# Patient Record
Sex: Female | Born: 1940 | Race: White | Hispanic: No | Marital: Married | Smoking: Never smoker
Health system: Southern US, Community
[De-identification: ages and names within clinical notes are randomized; demographics above are authoritative.]

## PROBLEM LIST (undated history)

## (undated) DIAGNOSIS — K219 Gastro-esophageal reflux disease without esophagitis: Secondary | ICD-10-CM

## (undated) DIAGNOSIS — IMO0001 Reserved for inherently not codable concepts without codable children: Secondary | ICD-10-CM

## (undated) HISTORY — DX: Gastro-esophageal reflux disease without esophagitis: K21.9

## (undated) HISTORY — PX: NOSE SURGERY: SHX723

## (undated) HISTORY — DX: Reserved for inherently not codable concepts without codable children: IMO0001

## (undated) HISTORY — PX: OTHER SURGICAL HISTORY: SHX169

---

## 1998-09-14 ENCOUNTER — Other Ambulatory Visit: Admission: RE | Admit: 1998-09-14 | Discharge: 1998-09-14 | Payer: Self-pay | Admitting: Oral Surgery

## 2000-09-04 ENCOUNTER — Other Ambulatory Visit: Admission: RE | Admit: 2000-09-04 | Discharge: 2000-09-04 | Payer: Self-pay | Admitting: Obstetrics and Gynecology

## 2001-12-23 ENCOUNTER — Other Ambulatory Visit: Admission: RE | Admit: 2001-12-23 | Discharge: 2001-12-23 | Payer: Self-pay | Admitting: Obstetrics and Gynecology

## 2002-02-27 ENCOUNTER — Encounter: Payer: Self-pay | Admitting: Emergency Medicine

## 2002-02-27 ENCOUNTER — Inpatient Hospital Stay (HOSPITAL_COMMUNITY): Admission: EM | Admit: 2002-02-27 | Discharge: 2002-02-28 | Payer: Self-pay | Admitting: Emergency Medicine

## 2002-12-25 ENCOUNTER — Other Ambulatory Visit: Admission: RE | Admit: 2002-12-25 | Discharge: 2002-12-25 | Payer: Self-pay | Admitting: Obstetrics and Gynecology

## 2005-01-06 ENCOUNTER — Ambulatory Visit (HOSPITAL_COMMUNITY): Admission: RE | Admit: 2005-01-06 | Discharge: 2005-01-06 | Payer: Self-pay | Admitting: Gastroenterology

## 2008-10-20 ENCOUNTER — Ambulatory Visit: Payer: Self-pay | Admitting: Obstetrics and Gynecology

## 2008-10-21 ENCOUNTER — Ambulatory Visit: Payer: Self-pay | Admitting: Obstetrics and Gynecology

## 2008-11-03 ENCOUNTER — Encounter: Admission: RE | Admit: 2008-11-03 | Discharge: 2008-11-03 | Payer: Self-pay | Admitting: Gastroenterology

## 2008-11-03 ENCOUNTER — Emergency Department (HOSPITAL_COMMUNITY): Admission: EM | Admit: 2008-11-03 | Discharge: 2008-11-03 | Payer: Self-pay | Admitting: Emergency Medicine

## 2009-07-12 ENCOUNTER — Encounter: Admission: RE | Admit: 2009-07-12 | Discharge: 2009-07-12 | Payer: Self-pay | Admitting: Gastroenterology

## 2010-06-16 ENCOUNTER — Encounter: Admission: RE | Admit: 2010-06-16 | Discharge: 2010-06-16 | Payer: Self-pay | Admitting: Gastroenterology

## 2010-07-26 ENCOUNTER — Ambulatory Visit: Payer: Self-pay | Admitting: Obstetrics and Gynecology

## 2010-08-02 ENCOUNTER — Ambulatory Visit: Payer: Self-pay | Admitting: Obstetrics and Gynecology

## 2010-11-13 ENCOUNTER — Encounter: Payer: Self-pay | Admitting: Gastroenterology

## 2010-11-28 IMAGING — CT CT CHEST W/O CM
2 of 3 series · 14 of 31 positions shown, 16 images · non-contrast
Comparison: Chest CT 07/12/2009.

CLINICAL DATA: Follow-up lung nodule.  No history of malignancy.

CT CHEST WITHOUT CONTRAST
TECHNIQUE: Multidetector CT imaging of the chest was performed
following the standard protocol without IV contrast.

[Series 3: routine chest · axial · 0.70mm/px · z∈[-177,+18]mm · 6 of 55 slices shown, 8 images]
[im 8/55  mediastinal]
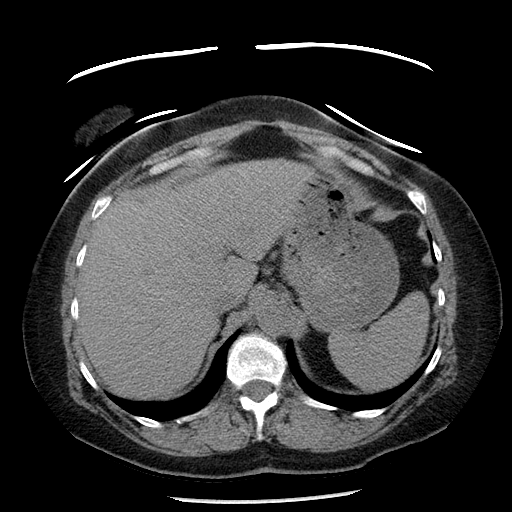
[im 8/55  lung]
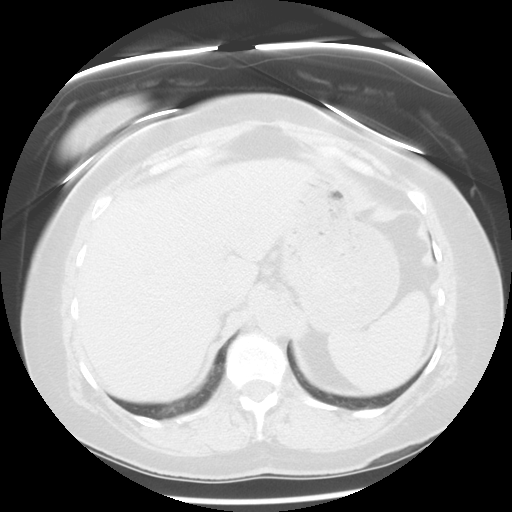
[im 16/55  lung]
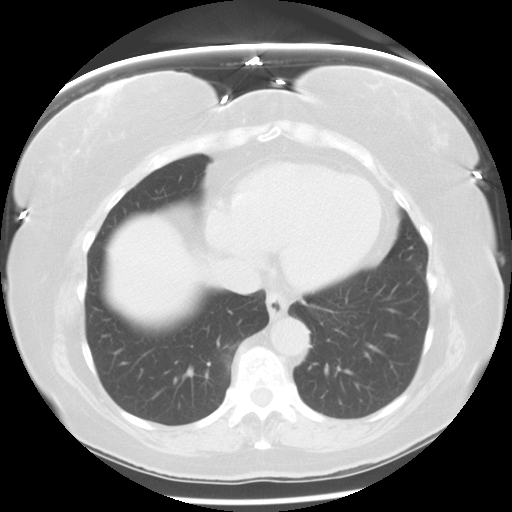
[im 24/55  lung]
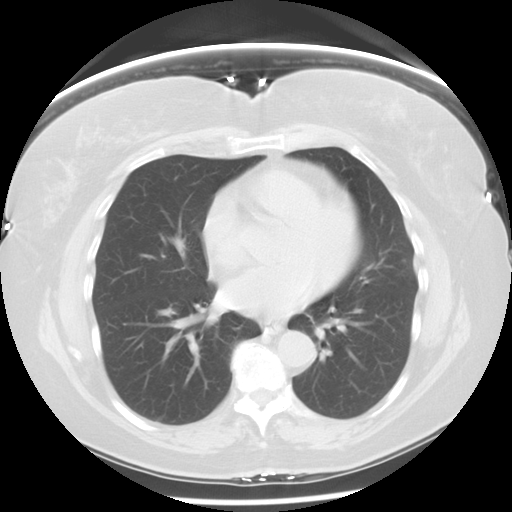
[im 31/55  lung]
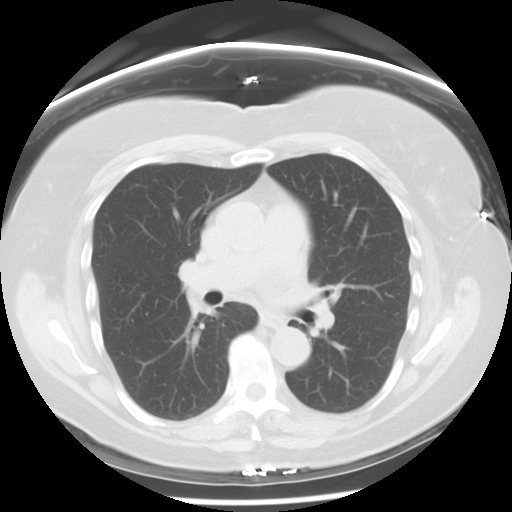
[im 39/55  mediastinal]
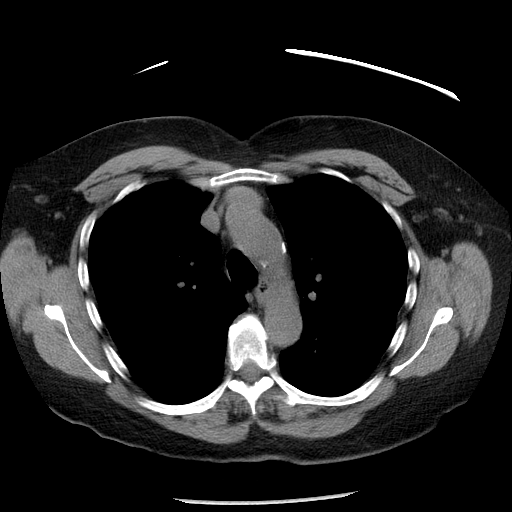
[im 39/55  lung]
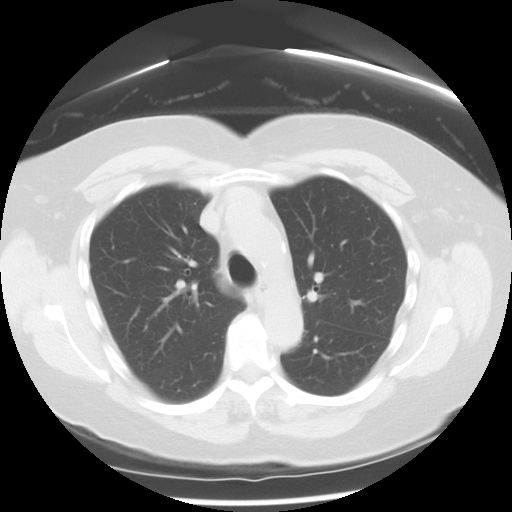
[im 47/55  lung]
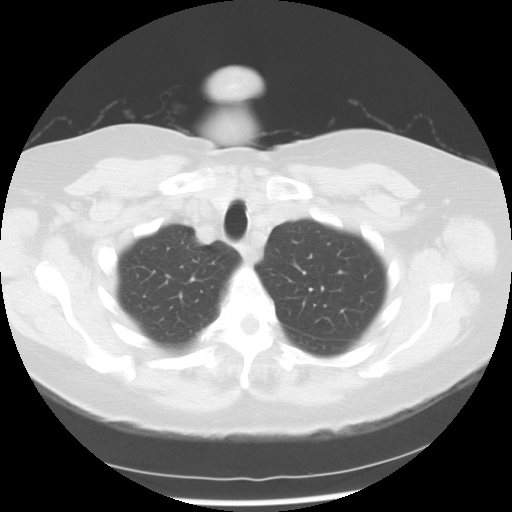

[Series 602: sagittal body · sagittal · 0.70mm/px · 8 of 145 slices shown]
[im 16/145  mediastinal]
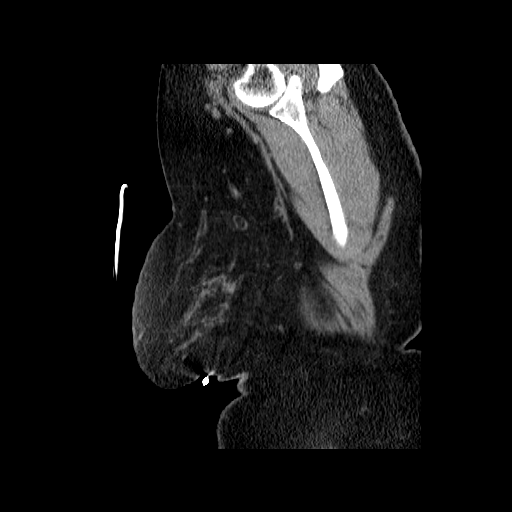
[im 31/145  mediastinal]
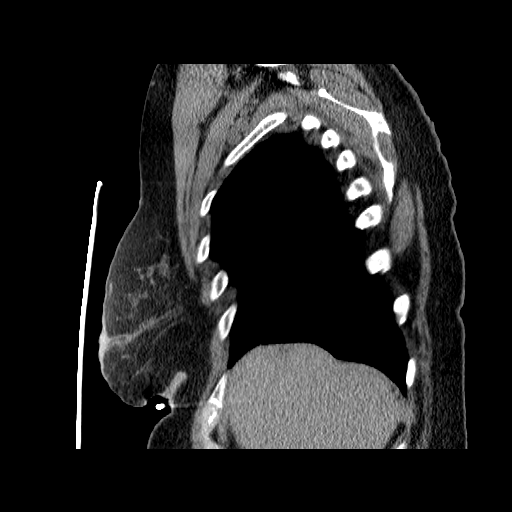
[im 46/145  mediastinal]
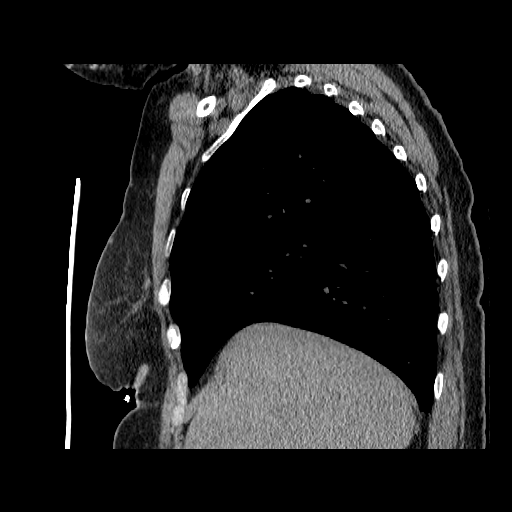
[im 69/145  mediastinal]
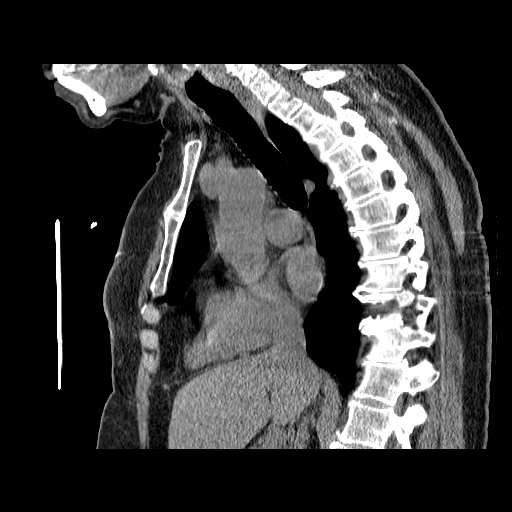
[im 76/145  mediastinal]
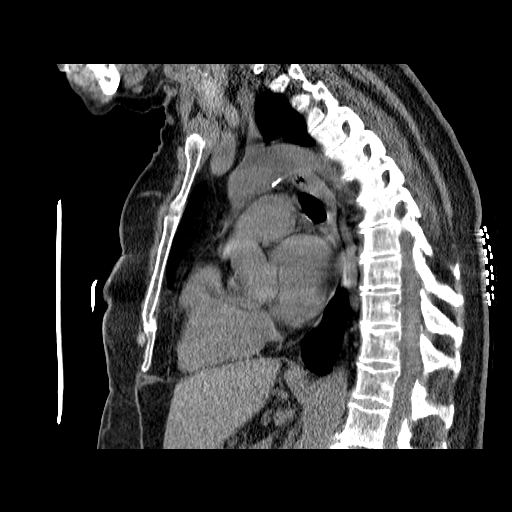
[im 99/145  mediastinal]
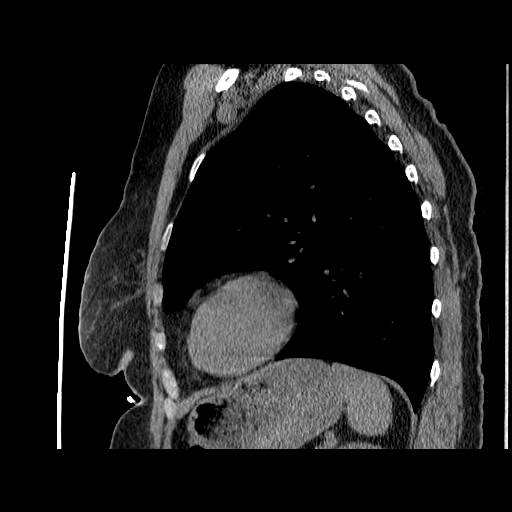
[im 114/145  mediastinal]
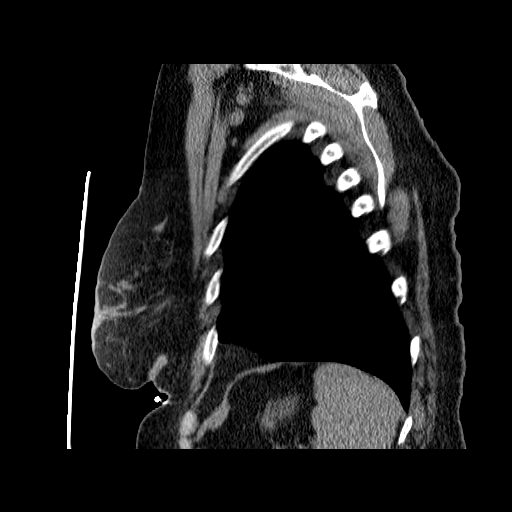
[im 129/145  mediastinal]
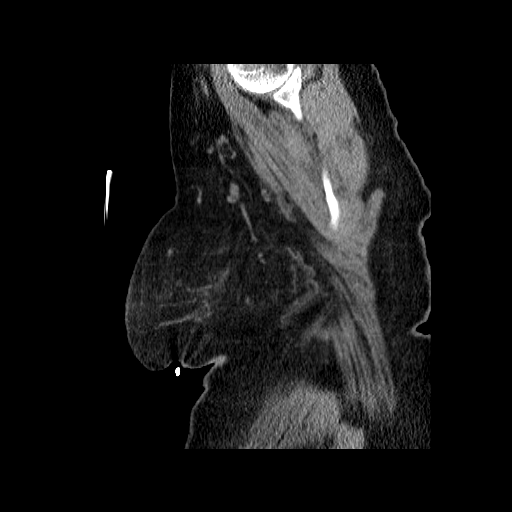

[14 of 31 positions shown; findings below may reference images not displayed]

FINDINGS: Mildly prominent axillary lymph nodes are stable with
fatty hila.  There are no enlarged mediastinal or hilar lymph
nodes.  There is no pleural or pericardial effusion.

Previously noted nodular density in the lingula is now barely
percent wall on image 32.  This measures 3 mm in diameter.  A tiny
subpleural nodular density in the right upper lobe on image 18 is
stable.  There are no suspicious pulmonary nodules.

The visualized upper abdomen appears unremarkable.  Mild thoracic
spondylosis and scoliosis are unchanged.
IMPRESSION: Decreased size of tiny lingular nodule consistent with a benign
etiology.  No acute or suspicious findings.

## 2011-03-10 NOTE — Op Note (Signed)
Anita Archer, Anita Archer             ACCOUNT NO.:  [id number]   MEDICAL RECORD NO.:  [id number]          PATIENT TYPE:  AMB   LOCATION:  ENDO                         FACILITY:  [facility]   PHYSICIAN:  John C. Madilyn Fireman, M.D.    DATE OF BIRTH:  12-28-1940   DATE OF PROCEDURE:  01/06/2005  DATE OF DISCHARGE:                                 OPERATIVE REPORT   INDICATIONS FOR PROCEDURE:  Chronic cough as well as epigastric pain in a  patient with history of peptic ulcer disease.   PROCEDURE:  The patient was placed in the left lateral decubitus position  and placed on the pulse monitor with continuous low-flow oxygen delivered by  nasal cannula. She was sedated with 50 mcg IV fentanyl, 5 mg IV Versed. The  Olympus video endoscope was advanced under direct vision into the oropharynx  and esophagus. The esophagus was straight and of normal caliber with  squamocolumnar line at 36 cm above a 2-cm hiatal hernia. There is no visible  ring, stricture or esophagitis and no reflux seen during the procedure. The  stomach was entered, and a small amount of liquid secretions were suctioned  from the fundus. Retroflexed view of the cardia confirmed a hiatal hernia  and was otherwise unremarkable. The fundus, body, antrum and pylorus all  appeared normal. The duodenum was entered, and both bulb and second portion  were well inspected and appeared to be within normal limits, with exception  of some mild inflammatory changes of the bulb consistent with duodenitis.  The scope was then withdrawn, and the patient returned to the recovery room  in stable condition. She tolerated the procedure well. There were no  immediate complications.   IMPRESSION:  1.  Duodenitis.  2.  Hiatal hernia.   PLAN:  Await CLO-test and will treat for eradication of helicobacter if  positive, otherwise will treat with proton pump inhibitor.      JCH/MEDQ  D:  01/06/2005  T:  01/06/2005  Job:  [id number]

## 2011-03-10 NOTE — Op Note (Signed)
NAMESHARMILA, WROBLESKI             ACCOUNT NO.:  [id number]   MEDICAL RECORD NO.:  [id number]          PATIENT TYPE:  AMB   LOCATION:  ENDO                         FACILITY:  [facility]   PHYSICIAN:  John C. Madilyn Fireman, M.D.    DATE OF BIRTH:  [birth year]   DATE OF PROCEDURE:  01/06/2005  DATE OF DISCHARGE:                                 OPERATIVE REPORT   PROCEDURE:  Colonoscopy.   INDICATIONS FOR PROCEDURE:  Average-risk colon cancer screening.   PROCEDURE:  The patient was placed in the left lateral decubitus position  and placed on the pulse monitor with continuous low flow oxygen delivered by  nasal cannula.  She was sedated with 25 mcg IV fentanyl and 2 mg IV Versed  in addition to the medicine given for the previous EGD.  The Olympus video  colonoscope is inserted into the rectum and advanced to the cecum, confirmed  by transillumination at McBurney's point and visualization of the ileocecal  valve and appendiceal orifice.  Prep is excellent.  The cecum, ascending,  transverse, descending, and sigmoid colon all appeared normal with no  masses, polyps, diverticula, or other mucosal abnormalities.  The rectum  likewise appeared normal, and retroflexed view of the anus revealed no  obvious internal hemorrhoids.  The scope was then withdrawn, and the patient  returned to the recovery room in stable condition.  She tolerated the  procedure well, and there were no immediate complications.   IMPRESSION:  Normal colonoscopy.   PLAN:  Repeat colonoscopy within 10 years and consider flexible  sigmoidoscopy or Hemoccult in five years.      JCH/MEDQ  D:  01/06/2005  T:  01/06/2005  Job:  [id number]   cc:   Dr. Alvera Novel

## 2012-06-20 ENCOUNTER — Encounter: Payer: Self-pay | Admitting: Gynecology

## 2012-07-01 ENCOUNTER — Ambulatory Visit (INDEPENDENT_AMBULATORY_CARE_PROVIDER_SITE_OTHER): Payer: Medicare Other | Admitting: Obstetrics and Gynecology

## 2012-07-01 ENCOUNTER — Encounter: Payer: Self-pay | Admitting: Obstetrics and Gynecology

## 2012-07-01 VITALS — BP 124/82 | Ht 63.0 in | Wt 168.0 lb

## 2012-07-01 DIAGNOSIS — N7013 Chronic salpingitis and oophoritis: Secondary | ICD-10-CM

## 2012-07-01 DIAGNOSIS — N814 Uterovaginal prolapse, unspecified: Secondary | ICD-10-CM

## 2012-07-01 DIAGNOSIS — D219 Benign neoplasm of connective and other soft tissue, unspecified: Secondary | ICD-10-CM

## 2012-07-01 DIAGNOSIS — N8111 Cystocele, midline: Secondary | ICD-10-CM

## 2012-07-01 DIAGNOSIS — N7011 Chronic salpingitis: Secondary | ICD-10-CM

## 2012-07-01 DIAGNOSIS — IMO0002 Reserved for concepts with insufficient information to code with codable children: Secondary | ICD-10-CM

## 2012-07-01 DIAGNOSIS — D259 Leiomyoma of uterus, unspecified: Secondary | ICD-10-CM

## 2012-07-01 NOTE — Progress Notes (Signed)
Patient came to see me today for further followup. She has uterine prolapse with cystocele. She is aware of both her bladder and her uterus prolapsed. She is having no significant urinary frequency, urinary incontinence or pelvic pressure. She is having no vaginal bleeding or pelvic pain. She has had a mammogram recently and will send [place] these results. She was told it was normal. She does her bone densities through her PCP and they have been normal. She has never had an abnormal Pap smear. Her last Pap smear was 2011 with her PCP.she is having no vaginal bleeding or pelvic pain. When we discovered her  uterine prolapse we went ahead and did an ultrasound and she has multiple fibroids as well as a left hydrosalpinx. She had ultrasounds both in 2009 and 2011.  ROS: 12 system review done. Pertinent positives above. Only other positive is gerd.  Physical examination:Kim Julian Reil present. HEENT within normal limits. Neck: Thyroid not large. No masses. Supraclavicular nodes: not enlarged. Breasts: Examined in both sitting and lying  position. No skin changes and no masses. Abdomen: Soft no guarding rebound or masses or hernia. Pelvic: External: Within normal limits. BUS: Within normal limits. Vaginal:within normal limits. Good estrogen effect. Second-degree cystocele. No evidence of  rectocele or enterocele. Cervix: clean. Uterus: Normal size and shape with second-degree prolapse. Adnexa: No masses. Rectovaginal exam: Confirmatory and negative.  Assessment: #1. Uterine prolapse #2. Cystocele #3. Fibroids #4. Hydrosalpinx  Plan: She will give me a copy of her mammogram. Pelvic ultrasound scheduled.The new Pap smear guidelines were discussed with the patient. No pap done. Extremities: Within normal limits.

## 2012-07-01 NOTE — Patient Instructions (Signed)
Schedule pelvic ultrasound. Get me copy of your  mammogram.

## 2012-07-18 ENCOUNTER — Ambulatory Visit (INDEPENDENT_AMBULATORY_CARE_PROVIDER_SITE_OTHER): Payer: Medicare Other | Admitting: Obstetrics and Gynecology

## 2012-07-18 ENCOUNTER — Ambulatory Visit: Payer: Medicare Other

## 2012-07-18 DIAGNOSIS — D219 Benign neoplasm of connective and other soft tissue, unspecified: Secondary | ICD-10-CM

## 2012-07-18 DIAGNOSIS — N7013 Chronic salpingitis and oophoritis: Secondary | ICD-10-CM

## 2012-07-18 DIAGNOSIS — N7011 Chronic salpingitis: Secondary | ICD-10-CM

## 2012-07-18 DIAGNOSIS — N83209 Unspecified ovarian cyst, unspecified side: Secondary | ICD-10-CM

## 2012-07-18 NOTE — Patient Instructions (Signed)
Report new pain to [place]. Followup ultrasound in one year.

## 2012-07-18 NOTE — Progress Notes (Signed)
Patient came back to see me today for an ultrasound for stability of adnexal masses. She has very rare pain in her left lower quadrant. On ultrasound today her uterus is enlarged by multiple fibroids. At least 4 were seen with the largest 3.1 cm. Her last ultrasound in 2011 the largest fibroid was 3.8 cm. Her endometrial echo today is 4.1 mm. On her right ovary today there is a multicystic avascular mass of 2.7 cm. It is thin walled. On her last scan we saw a similar structure but thought it  was probably a hydrosalpinx 2.2 cm. I think we are seeing the same mass and I can't be positive whether it is ovarian or tubal. On her left ovary there is a thin walled cyst which is also avascular at 2 cm which is unchanged from 2011 and a small hydrosalpinx of 2 cm. Her cul-de-sac is free of fluid.  Assessment: Nonsuspicious ovarian cysts. Hydrosalpinx bilateral. Multiple fibroids-stable  Plan: Discussed findings with the patient. I told her everything looked nonsuspicious. She knows that there are no guarantees without surgical pathology. She knows that there is been no change in 2 years. We both feel that surgical risks outweigh benefits. We therefore we'll continue to watch conservatively. She will report new pain and have followup ultrasound in one year.

## 2013-10-31 ENCOUNTER — Encounter: Payer: Self-pay | Admitting: Women's Health

## 2013-11-04 ENCOUNTER — Ambulatory Visit (INDEPENDENT_AMBULATORY_CARE_PROVIDER_SITE_OTHER): Payer: Medicare Other

## 2013-11-04 ENCOUNTER — Other Ambulatory Visit: Payer: Self-pay | Admitting: Women's Health

## 2013-11-04 ENCOUNTER — Encounter: Payer: Self-pay | Admitting: Women's Health

## 2013-11-04 ENCOUNTER — Ambulatory Visit (INDEPENDENT_AMBULATORY_CARE_PROVIDER_SITE_OTHER): Payer: Medicare Other | Admitting: Women's Health

## 2013-11-04 VITALS — BP 120/70 | Ht 63.0 in | Wt 163.0 lb

## 2013-11-04 DIAGNOSIS — N83339 Acquired atrophy of ovary and fallopian tube, unspecified side: Secondary | ICD-10-CM

## 2013-11-04 DIAGNOSIS — N83209 Unspecified ovarian cyst, unspecified side: Secondary | ICD-10-CM

## 2013-11-04 DIAGNOSIS — N7011 Chronic salpingitis: Secondary | ICD-10-CM

## 2013-11-04 DIAGNOSIS — N7013 Chronic salpingitis and oophoritis: Secondary | ICD-10-CM

## 2013-11-04 DIAGNOSIS — N854 Malposition of uterus: Secondary | ICD-10-CM

## 2013-11-04 DIAGNOSIS — R1909 Other intra-abdominal and pelvic swelling, mass and lump: Secondary | ICD-10-CM

## 2013-11-04 DIAGNOSIS — D252 Subserosal leiomyoma of uterus: Secondary | ICD-10-CM

## 2013-11-04 DIAGNOSIS — D259 Leiomyoma of uterus, unspecified: Secondary | ICD-10-CM

## 2013-11-04 DIAGNOSIS — D251 Intramural leiomyoma of uterus: Secondary | ICD-10-CM

## 2013-11-04 NOTE — Progress Notes (Signed)
Anita Archer 01/19/1941 [id number]    History:    The patient presents for breast and pelvic exam. History of fibroid uterus and ovarian cysts. Normal Pap and mammogram history. Reflux managed by primary care.  Past medical history, past surgical history, family history and social history were all reviewed and documented in the EPIC chart.   Exam:  Filed Vitals:   11/04/13 1137  BP: 120/70    General appearance:  Normal Head/Neck:  Normal, without cervical or supraclavicular adenopathy. Thyroid:  Symmetrical, normal in size, without palpable masses or nodularity. Respiratory  Effort:  Normal  Auscultation:  Clear without wheezing or rhonchi Cardiovascular  Auscultation:  Regular rate, without rubs, murmurs or gallops  Edema/varicosities:  Not grossly evident Abdominal  Soft,nontender, without masses, guarding or rebound.  Liver/spleen:  No organomegaly noted  Hernia:  None appreciated  Skin  Inspection:  Grossly normal  Palpation:  Grossly normal Neurologic/psychiatric  Orientation:  Normal with appropriate conversation.  Mood/affect:  Normal  Genitourinary    Breasts: Examined lying and sitting.     Right: Without masses, retractions, discharge or axillary adenopathy.     Left: Without masses, retractions, discharge or axillary adenopathy.   Inguinal/mons:  Normal without inguinal adenopathy  External genitalia:  Normal  BUS/Urethra/Skene's glands:  Normal  Bladder:  Normal  Vagina:  Normal  Cervix:  Normal  Uterus:  Enlarged/bulky shape and contour.  Midline and mobile  Adnexa/parametria:     Rt: Without masses or tenderness.   Lt: Without masses or tenderness.  Anus and perineum: Normal  Digital rectal exam: Normal sphincter tone without palpated masses or tenderness  Assessment/Plan:  73 y.o. MWF for breast, pelvic exam and ultrasound.  Fibroid uterus Ovarian cyst slightly larger GERD-primary care manages labs and meds  Ultrasound: Retroverted uterus  with numerous intramural and subserosal fibroids with minimal change. Right ovary thin-walled avascular serpentine mass with thin septations 30 x 31 x 26 mm, (27 x 23 x 17 mm 06/2012), left ovary echo-free cyst 23 x 21 x 17 mm no change.  Plan: CA 125. If no change continue to monitor ultrasounds. SBE's, continue annual mammogram, continue active, healthy lifestyle. Vitamin D 2000 daily. Repeat DEXA this year. Home safety, fall prevention discussed.  [place], 6:59 PM 11/04/2013

## 2013-11-04 NOTE — Patient Instructions (Signed)
Health Recommendations for Postmenopausal Women Respected and ongoing research has looked at the most common causes of death, disability, and poor quality of life in postmenopausal women. The causes include heart disease, diseases of blood vessels, diabetes, depression, cancer, and bone loss (osteoporosis). Many things can be done to help lower the chances of developing these and other common problems: CARDIOVASCULAR DISEASE Heart Disease: A heart attack is a medical emergency. Know the signs and symptoms of a heart attack. Below are things women can do to reduce their risk for heart disease.   Do not smoke. If you smoke, quit.  Aim for a healthy weight. Being overweight causes many preventable deaths. Eat a healthy and balanced diet and drink an adequate amount of liquids.  Get moving. Make a commitment to be more physically active. Aim for 30 minutes of activity on most, if not all days of the week.  Eat for heart health. Choose a diet that is low in saturated fat and cholesterol and eliminate trans fat. Include whole grains, vegetables, and fruits. Read and understand the labels on food containers before buying.  Know your numbers. Ask your caregiver to check your blood pressure, cholesterol (total, HDL, LDL, triglycerides) and blood glucose. Work with your caregiver on improving your entire clinical picture.  High blood pressure. Limit or stop your table salt intake (try salt substitute and food seasonings). Avoid salty foods and drinks. Read labels on food containers before buying. Eating well and exercising can help control high blood pressure. STROKE  Stroke is a medical emergency. Stroke may be the result of a blood clot in a blood vessel in the brain or by a brain hemorrhage (bleeding). Know the signs and symptoms of a stroke. To lower the risk of developing a stroke:  Avoid fatty foods.  Quit smoking.  Control your diabetes, blood pressure, and irregular heart rate. THROMBOPHLEBITIS  (BLOOD CLOT) OF THE LEG  Becoming overweight and leading a stationary lifestyle may also contribute to developing blood clots. Controlling your diet and exercising will help lower the risk of developing blood clots. CANCER SCREENING  Breast Cancer: Take steps to reduce your risk of breast cancer.  You should practice "breast self-awareness." This means understanding the normal appearance and feel of your breasts and should include breast self-examination. Any changes detected, no matter how small, should be reported to your caregiver.  After age 40, you should have a clinical breast exam (CBE) every year.  Starting at age 40, you should consider having a mammogram (breast X-ray) every year.  If you have a family history of breast cancer, talk to your caregiver about genetic screening.  If you are at high risk for breast cancer, talk to your caregiver about having an MRI and a mammogram every year.  Intestinal or Stomach Cancer: Tests to consider are a rectal exam, fecal occult blood, sigmoidoscopy, and colonoscopy. Women who are high risk may need to be screened at an earlier age and more often.  Cervical Cancer:  Beginning at age 30, you should have a Pap test every 3 years as long as the past 3 Pap tests have been normal.  If you have had past treatment for cervical cancer or a condition that could lead to cancer, you need Pap tests and screening for cancer for at least 20 years after your treatment.  If you had a hysterectomy for a problem that was not cancer or a condition that could lead to cancer, then you no longer need Pap tests.    If you are between ages 65 and 70, and you have had normal Pap tests going back 10 years, you no longer need Pap tests.  If Pap tests have been discontinued, risk factors (such as a new sexual partner) need to be reassessed to determine if screening should be resumed.  Some medical problems can increase the chance of getting cervical cancer. In these  cases, your caregiver may recommend more frequent screening and Pap tests.  Uterine Cancer: If you have vaginal bleeding after reaching menopause, you should notify your caregiver.  Ovarian cancer: Other than yearly pelvic exams, there are no reliable tests available to screen for ovarian cancer at this time except for yearly pelvic exams.  Lung Cancer: Yearly chest X-rays can detect lung cancer and should be done on high risk women, such as cigarette smokers and women with chronic lung disease (emphysema).  Skin Cancer: A complete body skin exam should be done at your yearly examination. Avoid overexposure to the sun and ultraviolet light lamps. Use a strong sun block cream when in the sun. All of these things are important in lowering the risk of skin cancer. MENOPAUSE Menopause Symptoms: Hormone therapy products are effective for treating symptoms associated with menopause:  Moderate to severe hot flashes.  Night sweats.  Mood swings.  Headaches.  Tiredness.  Loss of sex drive.  Insomnia.  Other symptoms. Hormone replacement carries certain risks, especially in older women. Women who use or are thinking about using estrogen or estrogen with progestin treatments should discuss that with their caregiver. Your caregiver will help you understand the benefits and risks. The ideal dose of hormone replacement therapy is not known. The Food and Drug Administration (FDA) has concluded that hormone therapy should be used only at the lowest doses and for the shortest amount of time to reach treatment goals.  OSTEOPOROSIS Protecting Against Bone Loss and Preventing Fracture: If you use hormone therapy for prevention of bone loss (osteoporosis), the risks for bone loss must outweigh the risk of the therapy. Ask your caregiver about other medications known to be safe and effective for preventing bone loss and fractures. To guard against bone loss or fractures, the following is recommended:  If  you are less than age 50, take 1000 mg of calcium and at least 600 mg of Vitamin D per day.  If you are greater than age 50 but less than age 70, take 1200 mg of calcium and at least 600 mg of Vitamin D per day.  If you are greater than age 70, take 1200 mg of calcium and at least 800 mg of Vitamin D per day. Smoking and excessive alcohol intake increases the risk of osteoporosis. Eat foods rich in calcium and vitamin D and do weight bearing exercises several times a week as your caregiver suggests. DIABETES Diabetes Melitus: If you have Type I or Type 2 diabetes, you should keep your blood sugar under control with diet, exercise and recommended medication. Avoid too many sweets, starchy and fatty foods. Being overweight can make control more difficult. COGNITION AND MEMORY Cognition and Memory: Menopausal hormone therapy is not recommended for the prevention of cognitive disorders such as Alzheimer's disease or memory loss.  DEPRESSION  Depression may occur at any age, but is common in elderly women. The reasons may be because of physical, medical, social (loneliness), or financial problems and needs. If you are experiencing depression because of medical problems and control of symptoms, talk to your caregiver about this. Physical activity and   exercise may help with mood and sleep. Community and volunteer involvement may help your sense of value and worth. If you have depression and you feel that the problem is getting worse or becoming severe, talk to your caregiver about treatment options that are best for you. ACCIDENTS  Accidents are common and can be serious in the elderly woman. Prepare your house to prevent accidents. Eliminate throw rugs, place hand bars in the bath, shower and toilet areas. Avoid wearing high heeled shoes or walking on wet, snowy, and icy areas. Limit or stop driving if you have vision or hearing problems, or you feel you are unsteady with you movements and  reflexes. HEPATITIS C Hepatitis C is a type of viral infection affecting the liver. It is spread mainly through contact with blood from an infected person. It can be treated, but if left untreated, it can lead to severe liver damage over years. Many people who are infected do not know that the virus is in their blood. If you are a "baby-boomer", it is recommended that you have one screening test for Hepatitis C. IMMUNIZATIONS  Several immunizations are important to consider having during your senior years, including:   Tetanus, diptheria, and pertussis booster shot.  Influenza every year before the flu season begins.  Pneumonia vaccine.  Shingles vaccine.  Others as indicated based on your specific needs. Talk to your caregiver about these. Document Released: 12/01/2005 Document Revised: 09/25/2012 Document Reviewed: 07/27/2008 [employer] Patient Information 2014 [employer].  

## 2013-11-05 LAB — CA 125: CA 125: 9 U/mL (ref 0.0–30.2)

## 2014-08-24 ENCOUNTER — Encounter: Payer: Self-pay | Admitting: Women's Health

## 2014-11-05 ENCOUNTER — Encounter: Payer: Self-pay | Admitting: Women's Health

## 2014-11-05 ENCOUNTER — Ambulatory Visit (INDEPENDENT_AMBULATORY_CARE_PROVIDER_SITE_OTHER): Payer: Medicare Other | Admitting: Women's Health

## 2014-11-05 VITALS — BP 124/80 | Ht 63.0 in | Wt 161.0 lb

## 2014-11-05 DIAGNOSIS — N8110 Cystocele, unspecified: Secondary | ICD-10-CM

## 2014-11-05 DIAGNOSIS — N83202 Unspecified ovarian cyst, left side: Secondary | ICD-10-CM

## 2014-11-05 DIAGNOSIS — Z780 Asymptomatic menopausal state: Secondary | ICD-10-CM

## 2014-11-05 DIAGNOSIS — Z01411 Encounter for gynecological examination (general) (routine) with abnormal findings: Secondary | ICD-10-CM

## 2014-11-05 DIAGNOSIS — IMO0001 Reserved for inherently not codable concepts without codable children: Secondary | ICD-10-CM | POA: Insufficient documentation

## 2014-11-05 DIAGNOSIS — N8320 Unspecified ovarian cysts: Secondary | ICD-10-CM

## 2014-11-05 DIAGNOSIS — IMO0002 Reserved for concepts with insufficient information to code with codable children: Secondary | ICD-10-CM

## 2014-11-05 DIAGNOSIS — N83201 Unspecified ovarian cyst, right side: Secondary | ICD-10-CM

## 2014-11-05 NOTE — Progress Notes (Signed)
Anita Archer 04-Aug-1941 [id number]    History:    Presents for annual exam with worsened cystocele. Each time she urinates, needs to "push bladder back in",  symptoms increasingly worse over the past 6 months. Denies incontinence or urinary symptoms. Reports 2 sisters have had cystoceles with hysterectomies with sling. Postmenopausal, no HRT/no bleeding/abdominal pain. Normal pap and mammogram history. States last normal DEXA was 10 years ago. States last colonoscopy was over 20 years ago, no polyps found, anxious about getting another one.  History of bilateral ovarian cysts, last ultrasound 2015 unchanged from previous negative CA-125's.   Past medical history, past surgical history, family history and social history were all reviewed and documented in the EPIC chart. GERD. Takes care of husband and mentally slow sister. Breast cancer sister. Retired [employer] manager.  ROS:  A ROS was performed and pertinent positives and negatives are included.  Exam:  Filed Vitals:   11/05/14 1411  BP: 124/80    General appearance:  Normal Thyroid:  Symmetrical, normal in size, without palpable masses or nodularity. Respiratory  Auscultation:  Clear without wheezing or rhonchi Cardiovascular  Auscultation:  Regular rate, without rubs, murmurs or gallops  Edema/varicosities:  Not grossly evident Abdominal  Soft,nontender, without masses, guarding or rebound.  Liver/spleen:  No organomegaly noted  Hernia:  None appreciated  Skin  Inspection:  Grossly normal   Breasts: Examined lying and sitting.     Right: Without masses, retractions, discharge or axillary adenopathy.     Left: Without masses, retractions, discharge or axillary adenopathy. Gentitourinary   Inguinal/mons:  Normal without inguinal adenopathy  External genitalia:  Normal  BUS/Urethra/Skene's glands:  Normal  Vagina:  Uterine prolapse/Cystocele, grade 3  Cervix:  Normal  Uterus:  Normal in size, shape and contour.  Midline and  mobile  Adnexa/parametria:     Rt: Without masses or tenderness.   Lt: Without masses or tenderness.  Anus and perineum: Normal  Digital rectal exam: Normal sphincter tone without palpated masses or tenderness  Assessment/Plan:  74 y.o. MWF G1 for annual exam with cystocele.  Cystocele  Post menopausal/no HRT/no bleeding Bilateral ovarian cysts  Plan: Discussed options with Dr. Phineas Real, will schedule appointment to discuss surgical options.  Discussed fall prevention/home safety. MVI, calcium rich diet, vitamin D, exercise encouraged. Last normal mammogram 2015, will schedule. Normal Pap history, reviewed new guidelines. CA 125 for ovarian cysts. UA pending. Repeat DEXA, will schedule.    [place], 3:15 PM 11/05/2014

## 2014-11-05 NOTE — Patient Instructions (Signed)
Cystocele Repair Cystocele repair is surgery to remove a cystocele, which is a bulging, drooping area (hernia) of the bladder that extends into the vagina. This bulging occurs on the top front wall of the vagina. LET [facility] CARE PROVIDER KNOW ABOUT:   Any allergies you have.  All medicines you are taking, including vitamins, herbs, eye drops, creams, and over-the-counter medicines.  Use of steroids (by mouth or creams).  Previous problems you or members of your family have had with the use of anesthetics.  Any blood disorders you have.  Previous surgeries you have had.  Medical conditions you have.  Possibility of pregnancy, if this applies. RISKS AND COMPLICATIONS  Generally, this is a safe procedure. However, as with any procedure, complications can occur. Possible complications include:  Excessive bleeding.  Infection.  Injury to surrounding structures.  Problems related to anesthetics. The risks will vary depending on the type of anesthetic given.  Problems with the urinary catheter after surgery, such as blockage.  Return of the cystocele. BEFORE THE PROCEDURE   Ask your health care provider about changing or stopping your regular medicines. You may need to stop taking certain medicines 1 week before surgery.  Do not eat or drink anything after midnight the night before surgery.  If you smoke, do not smoke for at least 2 weeks before the surgery.  Do not drink any alcohol for 3 days before the surgery.  Arrange for someone to drive you home after your hospital stay and to help you with activities during recovery. PROCEDURE   You will be given a medicine that makes you sleep through the procedure (general anesthetic) or a medicine injected into your spine that numbs your body below the waist (spinal or epidural anesthetic). You will be asleep or be numbed through the entire procedure.  A thin, flexible tube (Foley catheter) will be placed in your bladder to  drain urine during and after the surgery.  The surgery is performed through the vagina. The front wall of the vagina is opened up, and the muscle between the bladder and vagina is pulled up to its normal position. This is reinforced with stitches or a piece of mesh. This removes the hernia so that the top of the vagina does not fall into the opening of the vagina.  The cut on the front wall of the vagina is then closed with absorbable stitches that do not need to be removed. AFTER THE PROCEDURE   You will be taken to a recovery area where your progress will be closely watched. Your breathing, blood pressure, and pulse (vital signs) will be checked often. When you are stable, you will be taken to a regular hospital room.  You will have a catheter in place to drain your bladder. This will stay in place for 2-7 days or until your bladder is working properly on its own.  You may have gauze packing in the vagina. This will be removed 1-2 days after the surgery.  You will be given pain medicine as needed and may be given a medicine that kills germs (antibiotic).  You will likely need to stay in the hospital for 1-2 days. Document Released: 10/06/2000 Document Revised: 06/11/2013 Document Reviewed: 03/28/2013 [facility] Patient Information 2015 [place], [place]. This information is not intended to replace advice given to you by your health care provider. Make sure you discuss any questions you have with your health care provider.

## 2014-11-06 LAB — URINALYSIS W MICROSCOPIC + REFLEX CULTURE
BACTERIA UA: NONE SEEN
Bilirubin Urine: NEGATIVE
CRYSTALS: NONE SEEN
Casts: NONE SEEN
Glucose, UA: NEGATIVE mg/dL
Hgb urine dipstick: NEGATIVE
Ketones, ur: NEGATIVE mg/dL
LEUKOCYTES UA: NEGATIVE
NITRITE: NEGATIVE
PH: 6 (ref 5.0–8.0)
PROTEIN: NEGATIVE mg/dL
Specific Gravity, Urine: 1.018 (ref 1.005–1.030)
UROBILINOGEN UA: 0.2 mg/dL (ref 0.0–1.0)

## 2014-11-06 LAB — CA 125: CA 125: 14 U/mL (ref <35)

## 2014-11-17 ENCOUNTER — Encounter: Payer: Self-pay | Admitting: Gynecology

## 2014-11-17 ENCOUNTER — Ambulatory Visit (INDEPENDENT_AMBULATORY_CARE_PROVIDER_SITE_OTHER): Payer: Medicare Other | Admitting: Gynecology

## 2014-11-17 VITALS — BP 130/80

## 2014-11-17 DIAGNOSIS — N8111 Cystocele, midline: Secondary | ICD-10-CM

## 2014-11-17 DIAGNOSIS — N952 Postmenopausal atrophic vaginitis: Secondary | ICD-10-CM

## 2014-11-17 DIAGNOSIS — N814 Uterovaginal prolapse, unspecified: Secondary | ICD-10-CM

## 2014-11-17 NOTE — Progress Notes (Signed)
Anita Archer June 29, 1941 [id number]        73 y.o.  G1P1001 presents having seen [place] with worsening cystocele symptoms. Has been bothered for several years but got worse this last year and now something hangs out all the time. Has to push it back to void. No issues of incontinence with it in place or out of place. No bowel movement issues. Is not sexually active.  Past medical history,surgical history, problem list, medications, allergies, family history and social history were all reviewed and documented in the EPIC chart.  Directed ROS with pertinent positives and negatives documented in the history of present illness/assessment and plan.  Exam: Kim assistant General appearance:  Normal Abdomen soft nontender without masses guarding rebound Pelvic external BUS vagina with atrophic changes. Cervix at the introitus with third-degree cystocele. No significant rectocele. Cuff appears to be supported.  Assessment/Plan:  74 y.o. G1P1001 with worsening cystocele/uterine prolapse. Does not appear to have associated continence issues. Does not appear to have significant rectocele. Options for management include observation, pessary, surgery reviewed. Which involved with surgery to include TVH possible BSO anterior colporrhaphy reviewed. Options for consideration of mesh also discussed but as she has had no prior surgery I think a straightforward colporrhaphy would be the first-line treatment. Issues of failure requiring repeat surgery also discussed. Issues of bladder problems following the surgery to include incontinence or retention also discussed. surgery and a 74 year old patient with associated general risks as cardiac/respiratory/thrombosis reviewed.  Particular risks to include transfusion, infection, damage to surrounding tissues including bladder ureters vessels bowel nerves requiring major reparative surgeries also discussed.  Patient desires a trial of pessary first. Ring pessary with support size  #2 fitted. Appears to give good support. Patient does not feel this. We'll go ahead and order this for her and she'll return for placement. She also will think about the surgical options in the interim.     Anastasio Auerbach MD, 12:20 PM 11/17/2014

## 2014-11-17 NOTE — Patient Instructions (Signed)
Office will call you when the pessary is available.

## 2014-12-03 ENCOUNTER — Ambulatory Visit (INDEPENDENT_AMBULATORY_CARE_PROVIDER_SITE_OTHER): Payer: Medicare Other | Admitting: Gynecology

## 2014-12-03 ENCOUNTER — Encounter: Payer: Self-pay | Admitting: Gynecology

## 2014-12-03 DIAGNOSIS — N8111 Cystocele, midline: Secondary | ICD-10-CM

## 2014-12-03 DIAGNOSIS — N814 Uterovaginal prolapse, unspecified: Secondary | ICD-10-CM

## 2014-12-03 NOTE — Progress Notes (Signed)
Anita Archer Oct 12, 1941 [id number]        73 y.o.  G1P1001 presents for pessary placement.  History of uterine prolapse with cystocele. #2 ring pessary with support fitted previously.  Past medical history,surgical history, problem list, medications, allergies, family history and social history were all reviewed and documented in the EPIC chart.  Directed ROS with pertinent positives and negatives documented in the history of present illness/assessment and plan.  Exam: Kim assistant General appearance:  Normal External BUS vagina with third-degree cystocele/second-degree uterine prolapse. Generalized atrophic changes. Bimanual without masses or tenderness. Ring pessary placed without difficulty. Patient ambulated afterwards and did well without discomfort or shifting of the pessary.  Assessment/Plan:  74 y.o. G1P1001 with cystocele/rectocele. Doing well with pessary. She does live at [facility] and normally I would have her come back in several weeks for recheck. She is going to try to see if she cannot remove and replace it herself. Regardless she'll return in 2-3 months for reexamination, sooner if any issues.     Anastasio Auerbach MD, 2:00 PM 12/03/2014

## 2014-12-03 NOTE — Patient Instructions (Signed)
Follow up in 2-3 months for pessary recheck. Sooner if any issues.

## 2014-12-15 ENCOUNTER — Encounter: Payer: Self-pay | Admitting: Gynecology

## 2014-12-15 ENCOUNTER — Ambulatory Visit (INDEPENDENT_AMBULATORY_CARE_PROVIDER_SITE_OTHER): Payer: Medicare Other | Admitting: Gynecology

## 2014-12-15 VITALS — BP 122/80

## 2014-12-15 DIAGNOSIS — N814 Uterovaginal prolapse, unspecified: Secondary | ICD-10-CM

## 2014-12-15 DIAGNOSIS — N8111 Cystocele, midline: Secondary | ICD-10-CM

## 2014-12-15 NOTE — Progress Notes (Signed)
Anita Archer May 07, 1941 [id number]        73 y.o.  G1P1001 Presents in follow up having recently had a Ring pessary with support size 2 placed.  Did well up until yesterday when she felt like her prolapse was occurring around the pessary.  No pain, urinary or bowel symptoms.  Past medical history,surgical history, problem list, medications, allergies, family history and social history were all reviewed and documented in the EPIC chart.  Directed ROS with pertinent positives and negatives documented in the history of present illness/assessment and plan.  Exam: Kim assistant General appearance:  Normal External BUS vagina with atrophic changes. With straining her cystocele came around the pessary which was remaining in place. Pessary was removed. Vaginal mucosa normal without evidence of irritation or erosion. Bimanual exam without masses or tenderness. Gellhorn 2-1/2 pessary fitted. Patient ambulated and said that it appears to give her good support  Assessment/Plan:  74 y.o. G1P1001 with cystocele/uterine prolapse. Will try a slightly larger pessary with the added support of the Gellhorn to keep the cystocele and place. I replaced her  Size 2 ring pessary for now and she'll return whenever the Gellhorn pessary is ordered and comes in.     Anastasio Auerbach MD, 2:00 PM 12/15/2014

## 2014-12-15 NOTE — Patient Instructions (Signed)
Office will call you when the pessary arrives.

## 2014-12-21 ENCOUNTER — Encounter: Payer: Self-pay | Admitting: Gynecology

## 2014-12-21 ENCOUNTER — Ambulatory Visit (INDEPENDENT_AMBULATORY_CARE_PROVIDER_SITE_OTHER): Payer: Medicare Other | Admitting: Gynecology

## 2014-12-21 VITALS — BP 120/74

## 2014-12-21 DIAGNOSIS — N8111 Cystocele, midline: Secondary | ICD-10-CM

## 2014-12-21 DIAGNOSIS — N814 Uterovaginal prolapse, unspecified: Secondary | ICD-10-CM

## 2014-12-21 NOTE — Patient Instructions (Signed)
Follow up in 2 months for pessary recheck.

## 2014-12-21 NOTE — Progress Notes (Signed)
Anita Archer 22-Jan-1941 [id number]        73 y.o.  G1P1001 Patient presents for placement of her new Gellhorn 2-1/2 pessary that we fitted her for previously. She had a failed #2 ring pessary that kept coming out.  Past medical history,surgical history, problem list, medications, allergies, family history and social history were all reviewed and documented in the EPIC chart.  Directed ROS with pertinent positives and negatives documented in the history of present illness/assessment and plan.  Exam: Kim assistant General appearance:  Normal External BUS vagina with atrophic changes. Second-degree cystocele. Mild uterine prolapse although not provoked. Uterus normal size midline mobile. Adnexa without masses  Gellhorn 2-1/2 pessary fitted without difficulty. Patient ambulated and did well with this.  Assessment/Plan:  74 y.o. G1P1001 with above history. Follow up in 2 months for pessary recheck, sooner if any issues.     Anastasio Auerbach MD, 2:11 PM 12/21/2014

## 2015-02-23 ENCOUNTER — Ambulatory Visit (INDEPENDENT_AMBULATORY_CARE_PROVIDER_SITE_OTHER): Payer: Medicare Other | Admitting: Gynecology

## 2015-02-23 ENCOUNTER — Encounter: Payer: Self-pay | Admitting: Gynecology

## 2015-02-23 VITALS — BP 122/82

## 2015-02-23 DIAGNOSIS — M5489 Other dorsalgia: Secondary | ICD-10-CM | POA: Diagnosis not present

## 2015-02-23 DIAGNOSIS — Z4689 Encounter for fitting and adjustment of other specified devices: Secondary | ICD-10-CM

## 2015-02-23 DIAGNOSIS — N8111 Cystocele, midline: Secondary | ICD-10-CM | POA: Diagnosis not present

## 2015-02-23 LAB — URINALYSIS W MICROSCOPIC + REFLEX CULTURE
Bilirubin Urine: NEGATIVE
CASTS: NONE SEEN
Crystals: NONE SEEN
GLUCOSE, UA: NEGATIVE mg/dL
Hgb urine dipstick: NEGATIVE
Ketones, ur: NEGATIVE mg/dL
Nitrite: NEGATIVE
PH: 6.5 (ref 5.0–8.0)
Protein, ur: NEGATIVE mg/dL
RBC / HPF: NONE SEEN RBC/hpf (ref <3)
Specific Gravity, Urine: 1.015 (ref 1.005–1.030)
Urobilinogen, UA: 0.2 mg/dL (ref 0.0–1.0)

## 2015-02-23 NOTE — Progress Notes (Signed)
Anita Archer July 08, 1941 [id number]        73 y.o.  G1P1001 with history of cystocele and uterine prolapse. Using Gellhorn 2-1/2 pessary. Has done well with this noting good support and no issues. No bleeding/bladder or rectal complaints.  Does note a little bit of right-sided flank pain upon awakening this morning. No dysuria frequency urgency.  Past medical history,surgical history, problem list, medications, allergies, family history and social history were all reviewed and documented in the EPIC chart.  Directed ROS with pertinent positives and negatives documented in the history of present illness/assessment and plan.  Exam: Kim assistant Filed Vitals:   02/23/15 1202  BP: 122/82   General appearance:  Normal Spine straight without CVA tenderness or muscle spasm Abdomen soft nontender without masses guarding rebound Pelvic external BUS vagina with atrophic changes. Pessary removed. Vaginal mucosa without evidence of irritation/erosion. Cervix atrophic. Uterus grossly normal midline mobile nontender. Adnexa without masses or tenderness.  Gelhorn pessary was cleansed and replaced without difficulty.  Assessment/Plan:  74 y.o. G1P1001 with cystocele/uterine prolapse with good response to pessary. Will follow up in 4 months for pessary recheck. Sooner if any issues. Urinalysis is negative. We'll follow up on culture but otherwise will monitor at present. If her right flank pain continues she'll follow up with her primary physician.    Anastasio Auerbach MD, 12:16 PM 02/23/2015

## 2015-02-23 NOTE — Addendum Note (Signed)
Addended by: Nelva Nay on: 02/23/2015 12:29 PM   Modules accepted: Orders

## 2015-02-23 NOTE — Patient Instructions (Signed)
Follow up in 4 months for pessary recheck. Sooner if any problems

## 2015-02-25 ENCOUNTER — Other Ambulatory Visit: Payer: Self-pay | Admitting: Gynecology

## 2015-02-25 MED ORDER — SULFAMETHOXAZOLE-TRIMETHOPRIM 800-160 MG PO TABS: 1.0000 | ORAL_TABLET | Freq: Two times a day (BID) | ORAL | Status: DC

## 2015-02-26 LAB — URINE CULTURE: Colony Count: >=100000

## 2015-03-04 ENCOUNTER — Ambulatory Visit: Payer: Medicare Other | Admitting: Gynecology

## 2015-08-13 ENCOUNTER — Ambulatory Visit: Payer: Medicare Other | Admitting: Gynecology

## 2015-08-13 ENCOUNTER — Ambulatory Visit (INDEPENDENT_AMBULATORY_CARE_PROVIDER_SITE_OTHER): Payer: Medicare Other | Admitting: Gynecology

## 2015-08-13 ENCOUNTER — Encounter: Payer: Self-pay | Admitting: Gynecology

## 2015-08-13 VITALS — BP 124/78

## 2015-08-13 DIAGNOSIS — N814 Uterovaginal prolapse, unspecified: Secondary | ICD-10-CM | POA: Diagnosis not present

## 2015-08-13 DIAGNOSIS — N8111 Cystocele, midline: Secondary | ICD-10-CM | POA: Diagnosis not present

## 2015-08-13 DIAGNOSIS — Z4689 Encounter for fitting and adjustment of other specified devices: Secondary | ICD-10-CM

## 2015-08-13 NOTE — Progress Notes (Signed)
Anita Archer March 29, 1941 [id number]        74 y.o.  G1P1001 presents for pessary maintenance with history of cystocele/uterine prolapse. Good response to Gellhorn pessary.  Past medical history,surgical history, problem list, medications, allergies, family history and social history were all reviewed and documented in the EPIC chart.  Directed ROS with pertinent positives and negatives documented in the history of present illness/assessment and plan.  Exam: Kim assistant Filed Vitals:   08/13/15 1517  BP: 124/78   General appearance:  Normal External BUS vagina with atrophic changes. No evidence of irritation or erosion. Cervix flush with the upper vagina. Uterus grossly normal size midline mobile nontender. Adnexa without masses or tenderness.  Procedure: Patient's [place] pessary was removed, cleansed and replaced without difficulty.  Assessment/Plan:  74 y.o. G1P1001 for pessary maintenance. Will follow up in 5 months and she'll be due for her annual exam at that point.    Anastasio Auerbach MD, 3:29 PM 08/13/2015

## 2015-08-13 NOTE — Patient Instructions (Addendum)
Annual exam appointment in 5 months

## 2016-04-06 ENCOUNTER — Encounter: Payer: Self-pay | Admitting: Women's Health

## 2016-04-06 ENCOUNTER — Ambulatory Visit (INDEPENDENT_AMBULATORY_CARE_PROVIDER_SITE_OTHER): Payer: Medicare Other | Admitting: Women's Health

## 2016-04-06 VITALS — BP 122/78 | Ht 63.0 in | Wt 161.0 lb

## 2016-04-06 DIAGNOSIS — Z4689 Encounter for fitting and adjustment of other specified devices: Secondary | ICD-10-CM

## 2016-04-06 NOTE — Progress Notes (Signed)
Patient ID: Anita Archer, female   DOB: May 27, 1941, 75 y.o.   MRN: [id number] Presents for pessary maintenance. Doing well without incontinence or prolapse. History of fibroid uterus.  Exam: Appears well. External genitalia within normal limits, Gellhorn pessary removed with some difficulty, speculum exam cervix without lesion, discharge, erosion or irritation. Gellhorn pessary washed and replaced tolerated well.  Pessary maintenance  Plan: Return to office in 3 months to have the pessary maintenance

## 2016-06-05 ENCOUNTER — Encounter: Payer: Self-pay | Admitting: Gynecology

## 2017-05-21 ENCOUNTER — Ambulatory Visit (INDEPENDENT_AMBULATORY_CARE_PROVIDER_SITE_OTHER): Payer: Medicare Other | Admitting: Women's Health

## 2017-05-21 ENCOUNTER — Encounter: Payer: Self-pay | Admitting: Women's Health

## 2017-05-21 ENCOUNTER — Encounter: Payer: Self-pay | Admitting: Gynecology

## 2017-05-21 VITALS — BP 116/70

## 2017-05-21 DIAGNOSIS — Z4689 Encounter for fitting and adjustment of other specified devices: Secondary | ICD-10-CM

## 2017-05-21 NOTE — Progress Notes (Signed)
Presents for pessary maintenance. Has a Gellhorn pessary with good relief of cystocele symptoms. Reports biggest problem was a dryness with irritation with the cystocele, denies urinary leakage. Has had about a 30 pound weight loss in the past year. Denies vaginal discharge, irritation, urinary symptoms, abdominal pain. Lives most of the year in [place] is back home visiting and for [place] appointments.  Exam: Appears well. External genitalia within normal limits. With much difficulty removed pessary, tolerated well,  speculum exam, no visible areas of erosion. Superficial erythema at  posterior fourchette after removal of pessary  Pessary maintenance  Plan: Reviewed may be best to decrease size to a 2-1/4 inch Gellhorn states has apprehension concerning prolapse.Walked in the room without problem.  Has a ring pessary with support at her home in [facility], reports difficulty with keeping it in was several years ago, expulsed easily. States will place if cystocele becomes problematic between the time she returns to [place]. If decides would like  to order a 2-1/4 inch/ smaller Gellhorn pessary will call our office.

## 2019-07-31 ENCOUNTER — Encounter: Payer: Self-pay | Admitting: Gynecology
